# Patient Record
Sex: Male | Born: 2005 | Race: Black or African American | Hispanic: No | Marital: Single
Health system: Southern US, Community
[De-identification: ages and names within clinical notes are randomized; demographics above are authoritative.]

## PROBLEM LIST (undated history)

## (undated) ENCOUNTER — Ambulatory Visit: Payer: Medicaid Other | Source: Home / Self Care

## (undated) DIAGNOSIS — J45909 Unspecified asthma, uncomplicated: Secondary | ICD-10-CM

## (undated) DIAGNOSIS — L309 Dermatitis, unspecified: Secondary | ICD-10-CM

## (undated) HISTORY — DX: Dermatitis, unspecified: L30.9

## (undated) HISTORY — PX: CIRCUMCISION: SUR203

---
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
## 2024-01-09 NOTE — Progress Notes (Deleted)
 Follow Up Note  RE: James King MRN: [id number] DOB: [birth year] Date of Office Visit: 01/10/2024  Referring provider: Teresa Channel, MD Primary care provider: Teresa Channel, MD  Chief Complaint: No chief complaint on file.  History of Present Illness: I had the pleasure of seeing James King for a [facility]. He is a 18 y.o. male, who is being followed for allergic rhinoconjunctivitis, asthma, food allergy , atopic dermatitis. His previous allergy  office visit was on 07/14/2023 with Dr. Luke. Today is a regular follow up visit.  Discussed the use of AI scribe software for clinical note transcription with the patient, who gave verbal consent to proceed.  History of Present Illness            ***  Assessment and Plan: James King is a 18 y.o. male with: Seasonal and perennial allergic rhinoconjunctivitis Past history - 2017 skin testing positive to grass, weed, ragweed, trees, mold, dust mites, cat, dog, cockroach. Stopped AIT in June 2022. Interim history - Intermittent symptoms managed with Zyrtec  and nasal spray as needed. Continue environmental control measures. Use over the counter antihistamines such as Zyrtec  (cetirizine ), Claritin (loratadine), Allegra  (fexofenadine ), or Xyzal  (levocetirizine) daily as needed. May take twice a day during allergy  flares. May switch antihistamines every few months. Use Flonase  (fluticasone ) nasal spray 1-2 sprays per nostril once a day as needed for nasal congestion.  Nasal saline spray (i.e., Simply Saline) or nasal saline lavage (i.e., NeilMed) is recommended as needed and prior to medicated nasal sprays.   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 Patient uses Flovent  and Albuterol  as needed. Normal spirometry today. Wear a mask when working in a dusty environment.  Daily controller  medication(s): none. During respiratory infections/flares:  Start Flovent  110mcg 2 puffs twice a day with spacer and rinse mouth afterwards for 1-2 weeks until your breathing symptoms return to baseline.  Pretreat with albuterol  2 puffs or albuterol  nebulizer.  If you need to use your albuterol  nebulizer machine back to back within 15-30 minutes with no relief then please go to the ER/urgent care for further evaluation.  May use albuterol  rescue inhaler 2 puffs or nebulizer every 4 to 6 hours as needed for shortness of breath, chest tightness, coughing, and wheezing. May use albuterol  rescue inhaler 2 puffs 5 to 15 minutes prior to strenuous physical activities. Monitor frequency of use - if you need to use it more than twice per week on a consistent basis let us  know.    Anaphylactic reaction due to food, subsequent encounter Past history - 2017 skin testing positive to peanuts, tree nuts, coconut. 2024 bloodwork positive to peanuts, tree nuts and chickpeas. 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 Tolerates coconut. Interim history - no reactions.  Continue to avoid peanuts, tree nuts, and chickpeas. For mild symptoms you can take over the counter antihistamines such as Benadryl 1-2 tablets = 25-50mg  and monitor symptoms closely. If symptoms worsen or if you have severe symptoms including breathing issues, throat closure, significant swelling, whole body hives, severe diarrhea and vomiting, lightheadedness then inject epinephrine  and seek immediate medical care afterwards. Emergency action plan in place.    Tinea pedis of left foot Flared back up again. See handout.  Apply ketoconazole  cream once a day for 6 weeks.    Possible tinea capitis Patient reports thinning hair and itching in certain spots, concerned about possible ringworm. No definitive  signs of ringworm noted on examination. Some hair thinning noted.  You can try over the counter ketoconazole  1% shampoo - anti dandruff shampoo to  see it helps with the patches that you have. Use it twice a week.  If no improvement, recommend dermatology evaluation next.   Other atopic dermatitis Continue proper skin care.  Assessment and Plan              No follow-ups on file.  No orders of the defined types were placed in this encounter.  Lab Orders  No laboratory test(s) ordered today    Diagnostics: Spirometry:  Tracings reviewed. His effort: {Blank single:19197::Good reproducible efforts.,It was hard to get consistent efforts and there is a question as to whether this reflects a maximal maneuver.,Poor effort, data can not be interpreted.} FVC: ***L FEV1: ***L, ***% predicted FEV1/FVC ratio: ***% Interpretation: {Blank single:19197::Spirometry consistent with mild obstructive disease,Spirometry consistent with moderate obstructive disease,Spirometry consistent with severe obstructive disease,Spirometry consistent with possible restrictive disease,Spirometry consistent with mixed obstructive and restrictive disease,Spirometry uninterpretable due to technique,Spirometry consistent with normal pattern,No overt abnormalities noted given today's efforts}.  Please see scanned spirometry results for details.  Skin Testing: {Blank single:19197::Select foods,Environmental allergy  panel,Environmental allergy  panel and select foods,Food allergy  panel,None,Deferred due to recent antihistamines use}. *** Results discussed with patient/family.   Medication List:  Current Outpatient Medications  Medication Sig Dispense Refill  . Albuterol  Sulfate 2.5 MG/0.5ML NEBU PLEASE SPECIFY DIRECTIONS, REFILLS AND QUANTITY 75 mL 1  . EPINEPHrine  0.3 mg/0.3 mL IJ SOAJ injection Inject 0.3 mg into the muscle as needed for anaphylaxis. 4 each 1  . fexofenadine  (ALLEGRA  ALLERGY ) 180 MG tablet Take 1 tablet (180 mg total) by mouth daily. 30 tablet 5  . FLOVENT  HFA 110 MCG/ACT inhaler Inhale 2 puffs into the lungs  2 (two) times daily. 1 each 5  . fluticasone  (FLONASE ) 50 MCG/ACT nasal spray Place 1 spray into both nostrils 2 (two) times daily as needed (nasal congestion). 16 g 5  . fluticasone  (FLOVENT  HFA) 110 MCG/ACT inhaler During upper respiratory infections/flares take 2 puffs twice a day with spacer for 1-2 weeks or until symptoms return to baselin. 12 g 5  . ketoconazole  (NIZORAL ) 2 % cream Apply topically daily. Apply daily for 6 weeks until it clears up. 60 g 2  . Multiple Vitamin (MULTIVITAMIN) tablet Take 1 tablet by mouth daily.    . VENTOLIN  HFA 108 (90 Base) MCG/ACT inhaler Inhale 2 puffs into the lungs every 4 (four) hours as needed for wheezing or shortness of breath. 36 each 1   No current facility-administered medications for this visit.   Allergies: Allergies  Allergen Reactions  . Cherry Swelling    Other Reaction(s): Other (See Comments)  . Justicia Adhatoda Swelling  . Other Swelling    TREE NUTS  . Peanut  Oil Swelling    Tree nuts - swelling, Chickpeas - swelling, Cherries - rash   I reviewed his past medical history, social history, family history, and environmental history and no significant changes have been reported from his previous visit.  Review of Systems  Constitutional:  Negative for appetite change, chills, fever and unexpected weight change.  HENT:  Negative for congestion and rhinorrhea.   Eyes:  Negative for itching.  Respiratory:  Negative for cough, chest tightness, shortness of breath and wheezing.   Gastrointestinal:  Negative for abdominal pain.  Skin:  Positive for rash.  Allergic/Immunologic: Positive for environmental allergies and food allergies.  Neurological:  Negative for headaches.  Objective: There were no vitals taken for this visit. There is no height or weight on file to calculate BMI. Physical Exam Vitals and nursing note reviewed.  Constitutional:      Appearance: Normal appearance. He is well-developed.  HENT:     Head:  Normocephalic and atraumatic.     Right Ear: Tympanic membrane and external ear normal.     Left Ear: Tympanic membrane and external ear normal.     Nose: Nose normal.     Mouth/Throat:     Mouth: Mucous membranes are moist.     Pharynx: Oropharynx is clear.  Eyes:     Conjunctiva/sclera: Conjunctivae normal.  Cardiovascular:     Rate and Rhythm: Normal rate and regular rhythm.     Heart sounds: Normal heart sounds. No murmur heard. Pulmonary:     Effort: Pulmonary effort is normal.     Breath sounds: Normal breath sounds. No wheezing, rhonchi or rales.  Musculoskeletal:     Cervical back: Neck supple.  Skin:    General: Skin is warm and dry.     Findings: No rash.     Comments: Dry, scaling, peeling of the skin between the digits on the left foot and on the dorsal aspect. Few patches of hair thinning but no rash/lesions noted on scalp.  Neurological:     Mental Status: He is alert and oriented to person, place, and time.  Psychiatric:        Behavior: Behavior normal.   Previous notes and tests were reviewed. The plan was reviewed with the patient/family, and all questions/concerned were addressed.  It was my pleasure to see James King today and participate in his care. Please feel free to contact me with any questions or concerns.  Sincerely,  Orlan Cramp, DO Allergy  & Immunology  [facility]  [place] office: [phone] [facility] office: [phone]

## 2024-01-10 ENCOUNTER — Ambulatory Visit: Admitting: Allergy

## 2024-01-10 DIAGNOSIS — T7800XD Anaphylactic reaction due to unspecified food, subsequent encounter: Secondary | ICD-10-CM

## 2024-01-10 DIAGNOSIS — H1010 Acute atopic conjunctivitis, unspecified eye: Secondary | ICD-10-CM

## 2024-01-10 DIAGNOSIS — L2089 Other atopic dermatitis: Secondary | ICD-10-CM

## 2024-01-10 DIAGNOSIS — J4520 Mild intermittent asthma, uncomplicated: Secondary | ICD-10-CM

## 2024-01-14 ENCOUNTER — Ambulatory Visit: Admitting: Allergy

## 2024-01-14 NOTE — Progress Notes (Deleted)
 Follow Up Note  RE: James King MRN: [id number] DOB: [birth year] Date of Office Visit: 01/14/2024  Referring provider: Teresa Channel, MD Primary care provider: Teresa Channel, MD  Chief Complaint: No chief complaint on file.  History of Present Illness: I had the pleasure of seeing James King for a follow up visit at the [facility] on 01/14/2024. He is a 18 y.o. male, who is being followed for allergic rhinoconjunctivitis, asthma, food allergy , atopic dermatitis. His previous allergy  office visit was on 07/14/2023 with Dr. Luke. Today is a regular follow up visit.  Discussed the use of AI scribe software for clinical note transcription with the patient, who gave verbal consent to proceed.  History of Present Illness            ***  Assessment and Plan: James King is a 18 y.o. male with: Seasonal and perennial allergic rhinoconjunctivitis Past history - 2017 skin testing positive to grass, weed, ragweed, trees, mold, dust mites, cat, dog, cockroach. Stopped AIT in June 2022. Interim history - Intermittent symptoms managed with Zyrtec  and nasal spray as needed. Continue environmental control measures. Use over the counter antihistamines such as Zyrtec  (cetirizine ), Claritin (loratadine), Allegra  (fexofenadine ), or Xyzal  (levocetirizine) daily as needed. May take twice a day during allergy  flares. May switch antihistamines every few months. Use Flonase  (fluticasone ) nasal spray 1-2 sprays per nostril once a day as needed for nasal congestion.  Nasal saline spray (i.e., Simply Saline) or nasal saline lavage (i.e., NeilMed) is recommended as needed and prior to medicated nasal sprays.   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 Patient uses Flovent  and Albuterol  as needed. Normal spirometry today. Wear a mask when working in a dusty environment.  Daily controller  medication(s): none. During respiratory infections/flares:  Start Flovent  110mcg 2 puffs twice a day with spacer and rinse mouth afterwards for 1-2 weeks until your breathing symptoms return to baseline.  Pretreat with albuterol  2 puffs or albuterol  nebulizer.  If you need to use your albuterol  nebulizer machine back to back within 15-30 minutes with no relief then please go to the ER/urgent care for further evaluation.  May use albuterol  rescue inhaler 2 puffs or nebulizer every 4 to 6 hours as needed for shortness of breath, chest tightness, coughing, and wheezing. May use albuterol  rescue inhaler 2 puffs 5 to 15 minutes prior to strenuous physical activities. Monitor frequency of use - if you need to use it more than twice per week on a consistent basis let us  know.    Anaphylactic reaction due to food, subsequent encounter Past history - 2017 skin testing positive to peanuts, tree nuts, coconut. 2024 bloodwork positive to peanuts, tree nuts and chickpeas. 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 Tolerates coconut. Interim history - no reactions.  Continue to avoid peanuts, tree nuts, and chickpeas. For mild symptoms you can take over the counter antihistamines such as Benadryl 1-2 tablets = 25-50mg  and monitor symptoms closely. If symptoms worsen or if you have severe symptoms including breathing issues, throat closure, significant swelling, whole body hives, severe diarrhea and vomiting, lightheadedness then inject epinephrine  and seek immediate medical care afterwards. Emergency action plan in place.    Tinea pedis of left foot Flared back up again. See handout.  Apply ketoconazole  cream once a day for 6 weeks.    Possible tinea capitis Patient reports thinning hair and itching in certain spots, concerned about possible ringworm. No definitive  signs of ringworm noted on examination. Some hair thinning noted.  You can try over the counter ketoconazole  1% shampoo - anti dandruff shampoo to  see it helps with the patches that you have. Use it twice a week.  If no improvement, recommend dermatology evaluation next.   Other atopic dermatitis Continue proper skin care.  Assessment and Plan              No follow-ups on file.  No orders of the defined types were placed in this encounter.  Lab Orders  No laboratory test(s) ordered today    Diagnostics: Spirometry:  Tracings reviewed. His effort: {Blank single:19197::Good reproducible efforts.,It was hard to get consistent efforts and there is a question as to whether this reflects a maximal maneuver.,Poor effort, data can not be interpreted.} FVC: ***L FEV1: ***L, ***% predicted FEV1/FVC ratio: ***% Interpretation: {Blank single:19197::Spirometry consistent with mild obstructive disease,Spirometry consistent with moderate obstructive disease,Spirometry consistent with severe obstructive disease,Spirometry consistent with possible restrictive disease,Spirometry consistent with mixed obstructive and restrictive disease,Spirometry uninterpretable due to technique,Spirometry consistent with normal pattern,No overt abnormalities noted given today's efforts}.  Please see scanned spirometry results for details.  Skin Testing: {Blank single:19197::Select foods,Environmental allergy  panel,Environmental allergy  panel and select foods,Food allergy  panel,None,Deferred due to recent antihistamines use}. *** Results discussed with patient/family.   Medication List:  Current Outpatient Medications  Medication Sig Dispense Refill   Albuterol  Sulfate 2.5 MG/0.5ML NEBU PLEASE SPECIFY DIRECTIONS, REFILLS AND QUANTITY 75 mL 1   EPINEPHrine  0.3 mg/0.3 mL IJ SOAJ injection Inject 0.3 mg into the muscle as needed for anaphylaxis. 4 each 1   fexofenadine  (ALLEGRA  ALLERGY ) 180 MG tablet Take 1 tablet (180 mg total) by mouth daily. 30 tablet 5   FLOVENT  HFA 110 MCG/ACT inhaler Inhale 2 puffs into the lungs 2  (two) times daily. 1 each 5   fluticasone  (FLONASE ) 50 MCG/ACT nasal spray Place 1 spray into both nostrils 2 (two) times daily as needed (nasal congestion). 16 g 5   fluticasone  (FLOVENT  HFA) 110 MCG/ACT inhaler During upper respiratory infections/flares take 2 puffs twice a day with spacer for 1-2 weeks or until symptoms return to baselin. 12 g 5   ketoconazole  (NIZORAL ) 2 % cream Apply topically daily. Apply daily for 6 weeks until it clears up. 60 g 2   Multiple Vitamin (MULTIVITAMIN) tablet Take 1 tablet by mouth daily.     VENTOLIN  HFA 108 (90 Base) MCG/ACT inhaler Inhale 2 puffs into the lungs every 4 (four) hours as needed for wheezing or shortness of breath. 36 each 1   No current facility-administered medications for this visit.   Allergies: Allergies  Allergen Reactions   Cherry Swelling    Other Reaction(s): Other (See Comments)   Justicia Adhatoda Swelling   Other Swelling    TREE NUTS   Peanut  Oil Swelling    Tree nuts - swelling, Chickpeas - swelling, Cherries - rash   I reviewed his past medical history, social history, family history, and environmental history and no significant changes have been reported from his previous visit.  Review of Systems  Constitutional:  Negative for appetite change, chills, fever and unexpected weight change.  HENT:  Negative for congestion and rhinorrhea.   Eyes:  Negative for itching.  Respiratory:  Negative for cough, chest tightness, shortness of breath and wheezing.   Gastrointestinal:  Negative for abdominal pain.  Skin:  Positive for rash.  Allergic/Immunologic: Positive for environmental allergies and food allergies.  Neurological:  Negative for headaches.  Objective: There were no vitals taken for this visit. There is no height or weight on file to calculate BMI. Physical Exam Vitals and nursing note reviewed.  Constitutional:      Appearance: Normal appearance. He is well-developed.  HENT:     Head: Normocephalic and  atraumatic.     Right Ear: Tympanic membrane and external ear normal.     Left Ear: Tympanic membrane and external ear normal.     Nose: Nose normal.     Mouth/Throat:     Mouth: Mucous membranes are moist.     Pharynx: Oropharynx is clear.  Eyes:     Conjunctiva/sclera: Conjunctivae normal.  Cardiovascular:     Rate and Rhythm: Normal rate and regular rhythm.     Heart sounds: Normal heart sounds. No murmur heard. Pulmonary:     Effort: Pulmonary effort is normal.     Breath sounds: Normal breath sounds. No wheezing, rhonchi or rales.  Musculoskeletal:     Cervical back: Neck supple.  Skin:    General: Skin is warm and dry.     Findings: No rash.     Comments: Dry, scaling, peeling of the skin between the digits on the left foot and on the dorsal aspect. Few patches of hair thinning but no rash/lesions noted on scalp.  Neurological:     Mental Status: He is alert and oriented to person, place, and time.  Psychiatric:        Behavior: Behavior normal.    Previous notes and tests were reviewed. The plan was reviewed with the patient/family, and all questions/concerned were addressed.  It was my pleasure to see James King today and participate in his care. Please feel free to contact me with any questions or concerns.  Sincerely,  Orlan Cramp, DO Allergy  & Immunology  [facility]  [place] office: [phone] [facility] office: [phone]

## 2024-05-02 NOTE — ED Provider Notes (Addendum)
 [facility] Emergency Department Provider Note  Initial Impression/Assessment and Plan/ED Course   James King is a 18 y.o. male who p/w headache. Exam: VS WNL. Well-appearing adolescent male. AOx3. GCS 15. NAD. PERRL. CN2-12 intact. Scalp intact. MMM. Speech fluent. 5/5 strength x 4 ext. No unilateral sensation deficits. D/d includes but is not limited to ICH, primary headache, scalp-related, other. Plan: HA cocktail, reassess.   2:55 PM Headache improved. Pt continues to endorse feeling fuzzy-headed. He states he is experiencing brief vision changes as well. Will broaden w/u. CT head and basic labs ordered. If these diagnostics result negative and w/o acutely concerning abnormalities, anticipate discharge.   4:39 PM CBC w/ stable H/H. No leukocytosis. CMP w/ isolated tbili 1.8, other values not acutely concerning.   Exam, labs, interventions, and POC discussed w/ pt (and/or family/caregivers) who understand and agree and have no further questions. Given above, will proceed w/ stated plan.  Chief Complaint  Headache  History of Present Illness  James King is a 18 y.o. male c PMH asthma who p/w headache. Onset 3d ago. Apical and left distribution. It's like a buzzing all around. It's less like a headache and more like static all throughout. No analgesics PTA. Blurry vision. No difficulty w/ speech or ambulation. No focal extremity deficits. Pt does not take any medications. Normal PO intake. Smokes cannabis. Last drank alcohol approximately 2wks ago. No tobacco. Of note, pt had his hair put in braids 2wks ago and took them out 3d ago. He thinks he may be having scalp pain too.   Past Medical History/Past Surgical History   Past Medical History:  Diagnosis Date  . Asthma, unspecified asthma severity, unspecified whether complicated, unspecified whether persistent ([facility]HS-HCC)    History reviewed. No pertinent surgical history.  Medications     Allergies  Tree  nuts  Family History  No family history on file.  Social History   Social History   Tobacco Use  . Smoking status: Every Day    Types: Cigarettes  Substance Use Topics  . Drug use: Never    Physical Exam   VITAL SIGNS:   ED Triage Vitals  Encounter Vitals Group     BP 05/02/24 1207 (!) 149/82     Girls Systolic BP Percentile --      Girls Diastolic BP Percentile --      Boys Systolic BP Percentile --      Boys Diastolic BP Percentile --      Heart Rate 05/02/24 1207 89     Resp 05/02/24 1207 16     Temp 05/02/24 1207 36.8 C (98.2 F)     Temp src --      SpO2 05/02/24 1207 99 %     Weight --      Height --      Head Circumference --      Peak Flow --      Pain Score %% 05/02/24 1200 Five     Pain Loc --      Pain Education --      Exclude from Growth Chart --     Constitutional: Well-appearing adolescent male. AOx3. GCS 15. NAD. Eyes: PERRL. Conjunctiva clear.  HEENT: NCAT. MMM. Trachea midline.  Cardiovascular: No m/r/g. 2+ symmetric radial pulses. Respiratory: LCTAB. No w/r/r. No stridor. Speaks in complete sentences. WOB normal.  Gastrointestinal/GU: No TTP. No peritoneal signs.  Musculoskeletal: Lower extremities symmetric and non-edematous.  Neurologic: CN2-12 intact. No nystagmus or gaze palsy.  5/5 strength  x 4 ext. No unilateral deficits. Speech intact.  Integumentary: Warm, dry, intact. Scalp intact. Psychiatric: Mood/affect normal. Speech/behavior normal.  ECG   None  Radiology   Pending  Attestation   Pertinent labs & imaging results that were available during my care of the patient were reviewed by me and considered in my medical decision making (see chart for details).       Jolee Fonda Bumps, MD 05/02/24 1556    Jolee Fonda Bumps, MD 05/02/24 1640

## 2024-05-02 NOTE — ED Triage Notes (Addendum)
 Pt c/o headache pain to top of head that waxes and wanes over past 3 days, pt states I feel like my face is drooping, pt face is symmetrical, speech clear, MAE x 4 with purpose. Ambulatory across waiting room with steady gait. Pt has not taken any OTC pain meds for headache.

## 2024-05-05 ENCOUNTER — Ambulatory Visit (HOSPITAL_COMMUNITY)
Admission: RE | Admit: 2024-05-05 | Discharge: 2024-05-05 | Disposition: A | Discharge status: Home / Self Care | Source: Ambulatory Visit | Attending: Family Medicine | Admitting: Family Medicine

## 2024-05-05 ENCOUNTER — Other Ambulatory Visit (HOSPITAL_COMMUNITY): Payer: Self-pay | Admitting: Family Medicine

## 2024-05-05 DIAGNOSIS — R200 Anesthesia of skin: Secondary | ICD-10-CM

## 2024-05-05 MED ORDER — GADOBUTROL 1 MMOL/ML IV SOLN
7.0000 mL | Freq: Once | INTRAVENOUS | Status: AC | PRN
  Administered 2024-05-05: 7 mL via INTRAVENOUS

## 2024-05-30 ENCOUNTER — Ambulatory Visit: Attending: Family Medicine

## 2024-05-30 ENCOUNTER — Other Ambulatory Visit: Payer: Self-pay | Admitting: *Deleted

## 2024-05-30 DIAGNOSIS — R002 Palpitations: Secondary | ICD-10-CM

## 2024-05-30 NOTE — Progress Notes (Unsigned)
 Enrolled for [employer] to mail a ZIO XT long term holter monitor to the patients address on file.   EP to read

## 2024-07-03 DIAGNOSIS — R002 Palpitations: Secondary | ICD-10-CM | POA: Diagnosis not present

## 2024-09-01 ENCOUNTER — Ambulatory Visit: Admitting: Neurology
